# Patient Record
Sex: Male | Born: 2000
Health system: Southern US, Community
[De-identification: ages and names within clinical notes are randomized; demographics above are authoritative.]

## PROBLEM LIST (undated history)

## (undated) DIAGNOSIS — J45909 Unspecified asthma, uncomplicated: Secondary | ICD-10-CM

## (undated) DIAGNOSIS — L0100 Impetigo, unspecified: Secondary | ICD-10-CM

## (undated) HISTORY — PX: HYPOSPADIAS CORRECTION: SHX483

---
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
## 2020-07-23 IMAGING — CR DG LUMBAR SPINE COMPLETE 4+V
1 series · 5 of 5 positions shown · non-contrast
Comparison: Thoracic and lumbar radiographs 03/18/2018.

CLINICAL DATA: 18-year-old male with low back pain since yesterday
morning. Remote history of a lumbar fracture but no recent injury.

EXAM:
LUMBAR SPINE - COMPLETE 4+ VIEW

[Series 1: dg lumbar spine complete 4 +v · 0.14mm/px · 5 of 5 slices shown]
[im 1/5]
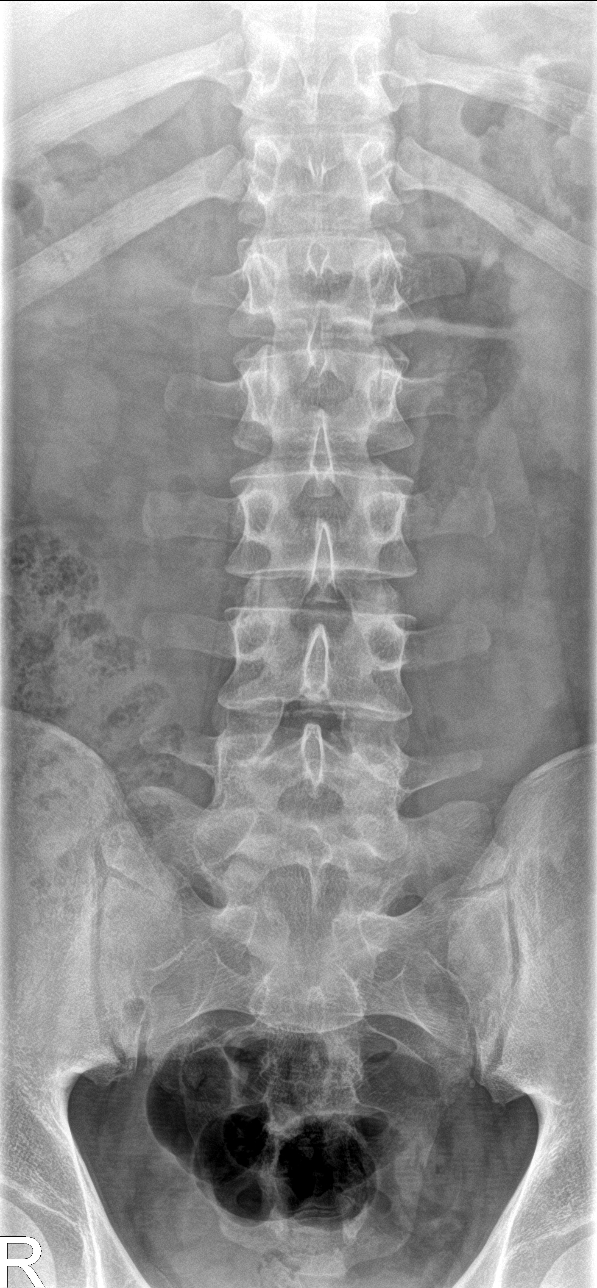
[im 2/5]
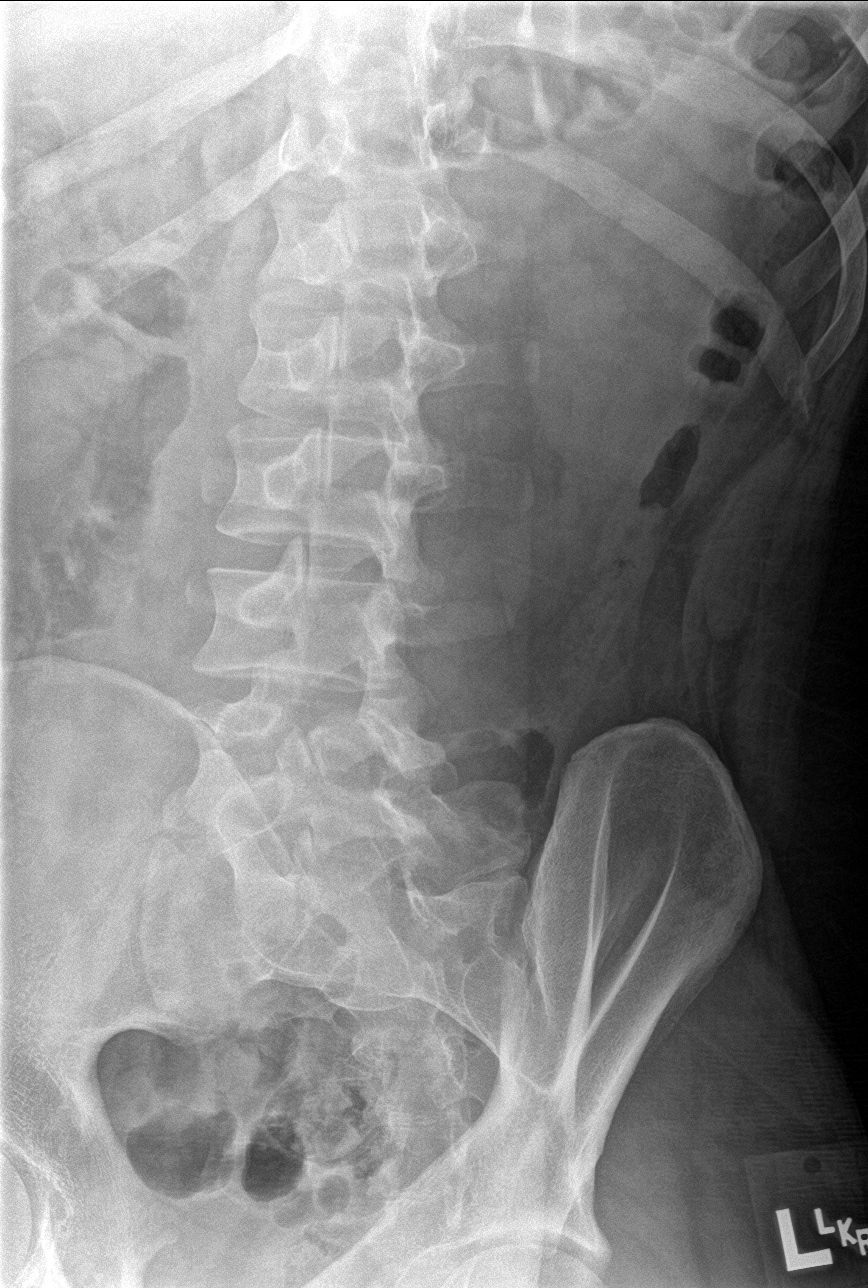
[im 3/5]
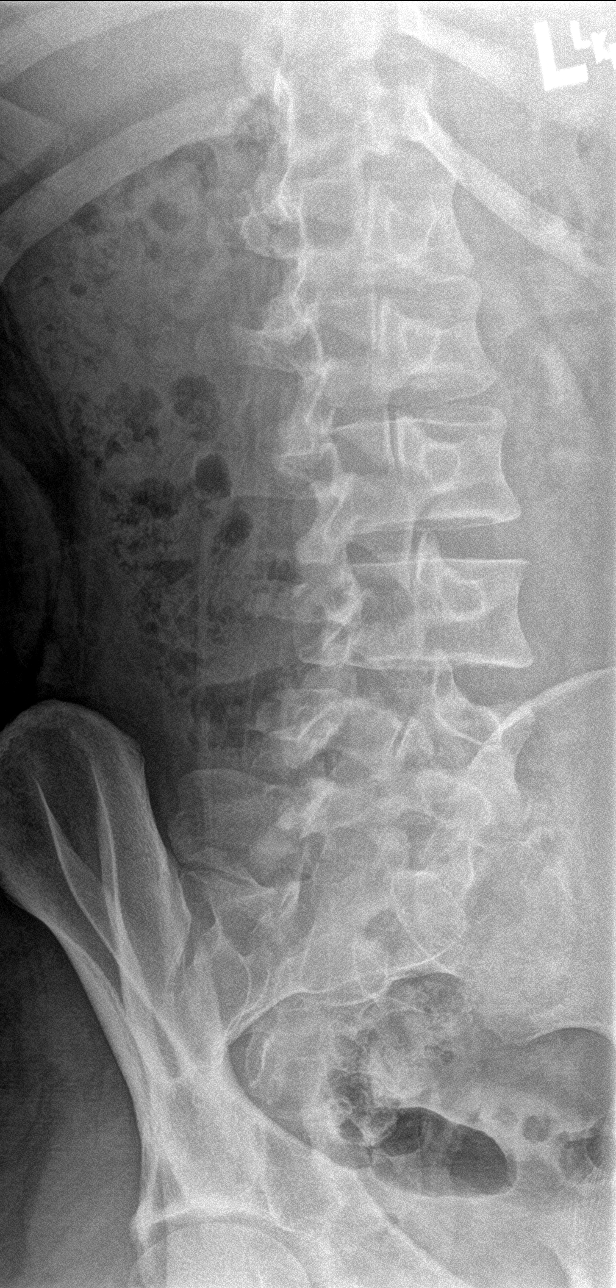
[im 4/5]
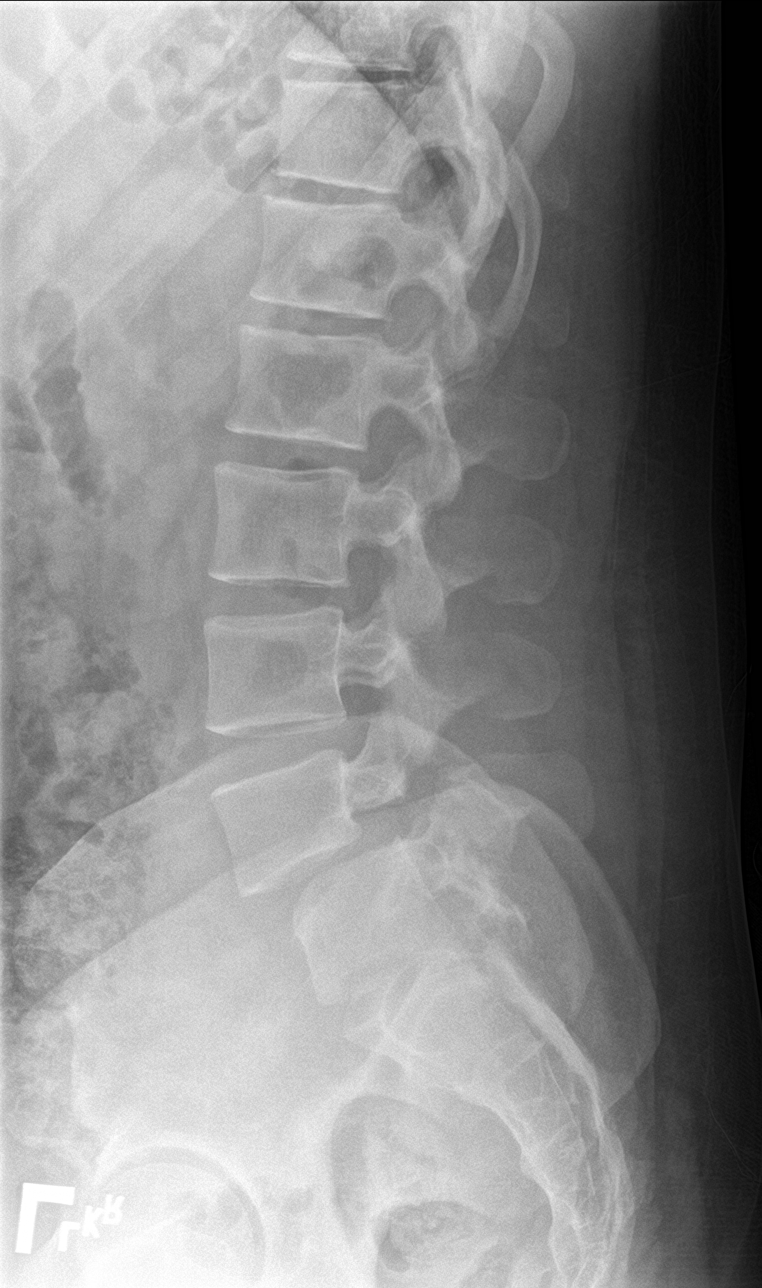
[im 5/5]
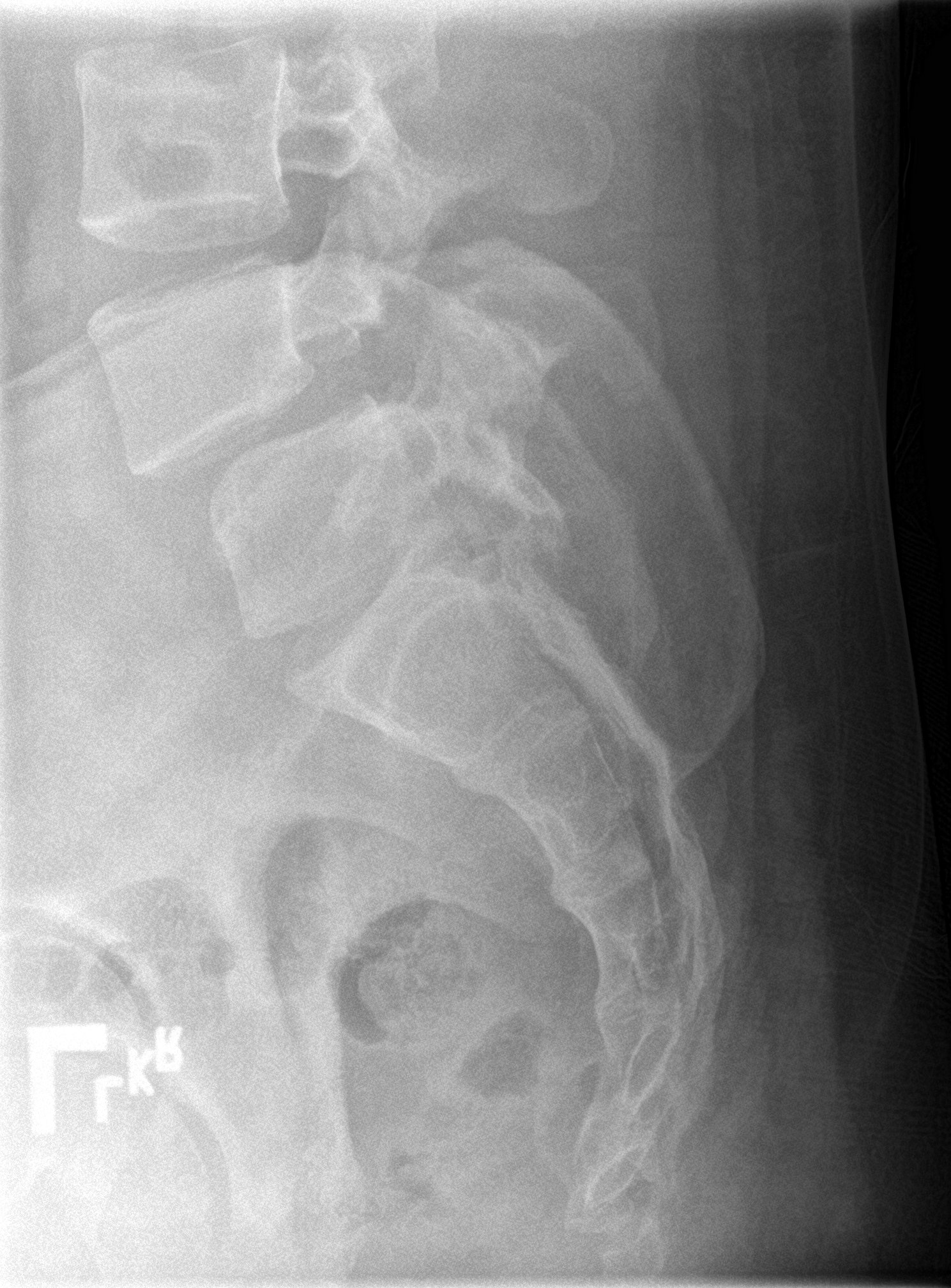

[5 of 5 positions shown; findings below may reference images not displayed]

FINDINGS: 11 pairs of ribs on the prior thoracic study and therefore absent
ribs at T12 and a partially sacralized appearance of L5.

Chronic L4 pars fractures. Anterolisthesis of L4 on L5 is grade 2,
approximately 10-11 millimeters and stable since 2019. Mild disc
space loss at that level. Normal lumbar lordosis and disc spaces
elsewhere. The other posterior elements appear intact. Sacrum and SI
joints appear within normal limits. Visible lower thoracic levels
appear intact. No acute osseous abnormality identified. Negative
abdominal visceral contours.
IMPRESSION: 1. Transitional anatomy with absent ribs at T12 and a partially
sacralized L5.
2. Chronic L4 pars fractures with grade 2 spondylolisthesis at
L4-L5. Associated disc degeneration.
3.  No acute osseous abnormality identified.

## 2023-01-20 ENCOUNTER — Encounter: Payer: Self-pay | Admitting: Family Medicine

## 2023-01-20 ENCOUNTER — Ambulatory Visit: Payer: Managed Care, Other (non HMO) | Admitting: Family Medicine

## 2023-01-20 VITALS — BP 120/80 | HR 70 | Temp 97.9°F | Ht 69.5 in | Wt 236.4 lb

## 2023-01-20 DIAGNOSIS — R400 Somnolence: Secondary | ICD-10-CM | POA: Diagnosis not present

## 2023-01-20 LAB — COMPREHENSIVE METABOLIC PANEL
ALT: 34 U/L (ref 0–53)
AST: 27 U/L (ref 0–37)
Albumin: 4.7 g/dL (ref 3.5–5.2)
Alkaline Phosphatase: 58 U/L (ref 39–117)
BUN: 11 mg/dL (ref 6–23)
CO2: 26 mEq/L (ref 19–32)
Calcium: 9.6 mg/dL (ref 8.4–10.5)
Chloride: 103 mEq/L (ref 96–112)
Creatinine, Ser: 0.8 mg/dL (ref 0.40–1.50)
GFR: 126.18 mL/min (ref >60.00)
Glucose, Bld: 99 mg/dL (ref 70–99)
Potassium: 3.3 mEq/L — ABNORMAL LOW (ref 3.5–5.1)
Sodium: 138 mEq/L (ref 135–145)
Total Bilirubin: 0.4 mg/dL (ref 0.2–1.2)
Total Protein: 7.7 g/dL (ref 6.0–8.3)

## 2023-01-20 LAB — CBC
HCT: 41.9 % (ref 39.0–52.0)
Hemoglobin: 14.8 g/dL (ref 13.0–17.0)
MCHC: 35.2 g/dL (ref 30.0–36.0)
MCV: 89.9 fl (ref 78.0–100.0)
Platelets: 363 10*3/uL (ref 150.0–400.0)
RBC: 4.66 Mil/uL (ref 4.22–5.81)
RDW: 13.1 % (ref 11.5–15.5)
WBC: 7.7 10*3/uL (ref 4.0–10.5)

## 2023-01-20 LAB — TSH: TSH: 1.39 u[IU]/mL (ref 0.35–5.50)

## 2023-01-20 NOTE — Patient Instructions (Signed)
5 mg of melatonin 45 minutes - 1 hour before bedtime  May take up to 20 mg of melatonin daily.

## 2023-01-20 NOTE — Progress Notes (Signed)
New Patient Office Visit  Subjective    Patient ID: Sean Casey, male    DOB: 2000-08-16  Age: 22 y.o. MRN: [id number]  CC:  Chief Complaint  Patient presents with   Establish Care    HPI Sean Casey presents to establish care Was previously a patient of [facility]. Patient is reporting trouble sleeping. He reports he travels a lot for work. 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 He reports that he feels like he is "up all night", even after 6- 6/12 hours he still feels exhausted. Also has nighttime awakenings, sometimes unable to fall back asleep. Has never tried anything over the counter to help him sleep. Occasional morning headaches, lots of daytime fatigue.   Patient has no other medical conditions and does not take any medications.    No current outpatient medications  Past Medical History:  Diagnosis Date   Asthma    Impetigo     Past Surgical History:  Procedure Laterality Date   HYPOSPADIAS CORRECTION      History reviewed. No pertinent family history.  Social History   Socioeconomic History   Marital status: Single    Spouse name: Not on file   Number of children: Not on file   Years of education: Not on file   Highest education level: Not on file  Occupational History   Not on file  Tobacco Use   Smoking status: Never   Smokeless tobacco: Never  Vaping Use   Vaping Use: Never used  Substance and Sexual Activity   Alcohol use: No   Drug use: No   Sexual activity: Not Currently  Other Topics Concern   Not on file  Social History Narrative   ** Merged History Encounter **       Social Determinants of Health   Financial Resource Strain: Not on file  Food Insecurity: Not on file  Transportation Needs: Not on file  Physical Activity: Not on file  Stress: Not on file  Social Connections: Not on file  Intimate Partner Violence: Not on file     Review of Systems  All other systems reviewed and are negative.       Objective    BP 120/80 (BP Location: Left Arm, Patient Position: Sitting, Cuff Size: Large)   Pulse 70   Temp 97.9 F (36.6 C) (Oral)   Ht 5' 9.5" (1.765 m)   Wt 236 lb 6.4 oz (107.2 kg)   SpO2 98%   BMI 34.41 kg/m   Physical Exam Vitals reviewed.  Constitutional:      Appearance: Normal appearance. He is well-groomed and normal weight.  Eyes:     Extraocular Movements: Extraocular movements intact.     Conjunctiva/sclera: Conjunctivae normal.  Neck:     Thyroid: No thyromegaly.  Cardiovascular:     Rate and Rhythm: Normal rate and regular rhythm.     Heart sounds: S1 normal and S2 normal. No murmur heard. Pulmonary:     Effort: Pulmonary effort is normal.     Breath sounds: Normal breath sounds and air entry. No rales.  Abdominal:     General: Abdomen is flat. Bowel sounds are normal.  Musculoskeletal:     Right lower leg: No edema.     Left lower leg: No edema.  Neurological:     General: No focal deficit present.     Mental Status: He is alert  and oriented to person, place, and time.     Gait: Gait is intact.  Psychiatric:        Mood and Affect: Mood and affect normal.         Assessment & Plan:  Daytime somnolence -     CBC -     TSH -     Comprehensive metabolic panel -     Ambulatory referral to Sleep Studies   Will send in order for home sleep study to rule out OSA. I will also check CBC, TSH to rule out other causes of daytime fatigue. Physical exam essentially normal today.  No follow-ups on file.   Karie Georges, MD

## 2023-02-17 ENCOUNTER — Encounter: Payer: Self-pay | Admitting: Family Medicine
